# Patient Record
Sex: Female | Born: 1990 | Race: Black or African American | Hispanic: No | Marital: Single | Smoking: Never smoker
Health system: Southern US, Community
[De-identification: ages and names within clinical notes are randomized; demographics above are authoritative.]

---

## 2015-08-11 ENCOUNTER — Encounter: Payer: Medicaid Other | Admitting: Obstetrics & Gynecology

## 2015-08-22 ENCOUNTER — Encounter: Payer: Self-pay | Admitting: *Deleted

## 2015-08-22 ENCOUNTER — Emergency Department: Payer: Medicaid Other

## 2015-08-22 ENCOUNTER — Emergency Department
Admission: EM | Admit: 2015-08-22 | Discharge: 2015-08-23 | Disposition: A | Discharge status: Home / Self Care | Payer: Medicaid Other | Attending: Emergency Medicine | Admitting: Emergency Medicine

## 2015-08-22 DIAGNOSIS — Y929 Unspecified place or not applicable: Secondary | ICD-10-CM | POA: Insufficient documentation

## 2015-08-22 DIAGNOSIS — M79641 Pain in right hand: Secondary | ICD-10-CM | POA: Diagnosis present

## 2015-08-22 DIAGNOSIS — S60221A Contusion of right hand, initial encounter: Secondary | ICD-10-CM

## 2015-08-22 DIAGNOSIS — Y999 Unspecified external cause status: Secondary | ICD-10-CM | POA: Insufficient documentation

## 2015-08-22 DIAGNOSIS — Y9389 Activity, other specified: Secondary | ICD-10-CM | POA: Diagnosis not present

## 2015-08-22 NOTE — ED Notes (Signed)
POCT Results were NEGATIVE  

## 2015-08-22 NOTE — ED Notes (Signed)
Urine was collected but label was not affixed. RN notified, will re-collect urine for UA analysis. Urine specimen was obtained long enough to get urine preg. Which was negative.
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
## 2015-08-23 MED ORDER — IBUPROFEN 600 MG PO TABS: 600.0000 mg | ORAL_TABLET | Freq: Three times a day (TID) | ORAL | Status: DC | PRN

## 2015-08-23 MED ORDER — IBUPROFEN 600 MG PO TABS
600.0000 mg | ORAL_TABLET | Freq: Once | ORAL | Status: AC
  Administered 2015-08-23: 600 mg via ORAL
  Filled 2015-08-23: qty 1

## 2015-08-23 MED ORDER — HYDROCODONE-ACETAMINOPHEN 5-325 MG PO TABS
1.0000 | ORAL_TABLET | Freq: Once | ORAL | Status: AC
  Administered 2015-08-23: 1 via ORAL
  Filled 2015-08-23: qty 1

## 2015-08-23 NOTE — Discharge Instructions (Signed)
1. Take pain medicine as needed (Motrin #15). 2. Apply ice to affected area several times daily. 3. Return to the ER for worsening symptoms, increased swelling, numbness/tingling or other concerns.  Hand Contusion A hand contusion is a deep bruise on your hand area. Contusions are the result of an injury that caused bleeding under the skin. The contusion may turn blue, purple, or yellow. Minor injuries will give you a painless contusion, but more severe contusions may stay painful and swollen for a few weeks. CAUSES  A contusion is usually caused by a blow, trauma, or direct force to an area of the body. SYMPTOMS   Swelling and redness of the injured area.  Discoloration of the injured area.  Tenderness and soreness of the injured area.  Pain. DIAGNOSIS  The diagnosis can be made by taking a history and performing a physical exam. An X-ray, CT scan, or MRI may be needed to determine if there were any associated injuries, such as broken bones (fractures). TREATMENT  Often, the best treatment for a hand contusion is resting, elevating, icing, and applying cold compresses to the injured area. Over-the-counter medicines may also be recommended for pain control. HOME CARE INSTRUCTIONS   Put ice on the injured area.  Put ice in a plastic bag.  Place a towel between your skin and the bag.  Leave the ice on for 15-20 minutes, 03-04 times a day.  Only take over-the-counter or prescription medicines as directed by your caregiver. Your caregiver may recommend avoiding anti-inflammatory medicines (aspirin, ibuprofen, and naproxen) for 48 hours because these medicines may increase bruising.  If told, use an elastic wrap as directed. This can help reduce swelling. You may remove the wrap for sleeping, showering, and bathing. If your fingers become numb, cold, or blue, take the wrap off and reapply it more loosely.  Elevate your hand with pillows to reduce swelling.  Avoid overusing your hand  if it is painful. SEEK IMMEDIATE MEDICAL CARE IF:   You have increased redness, swelling, or pain in your hand.  Your swelling or pain is not relieved with medicines.  You have loss of feeling in your hand or are unable to move your fingers.  Your hand turns cold or blue.  You have pain when you move your fingers.  Your hand becomes warm to the touch.  Your contusion does not improve in 2 days. MAKE SURE YOU:   Understand these instructions.  Will watch your condition.  Will get help right away if you are not doing well or get worse.   This information is not intended to replace advice given to you by your health care provider. Make sure you discuss any questions you have with your health care provider.   Document Released: 10/22/2001 Document Revised: 01/25/2012 Document Reviewed: 10/24/2011 Elsevier Interactive Patient Education 2016 Else[employer] Inc[employer]  Cryotherapy Cryotherapy means treatment with cold. Ice or gel packs can be used to reduce both pain and swelling. Ice is the most helpful within the first 24 to 48 hours after an injury or flare-up from overusing a muscle or joint. Sprains, strains, spasms, burning pain, shooting pain, and aches can all be eased with ice. Ice can also be used when recovering from surgery. Ice is effective, has very few side effects, and is safe for most people to use. PRECAUTIONS  Ice is not a safe treatment option for people with:  Raynaud phenomenon. This is a condition affecting small blood vessels in the extremities. Exposure to cold may  cause your problems to return.  Cold hypersensitivity. There are many forms of cold hypersensitivity, including:  Cold urticaria. Red, itchy hives appear on the skin when the tissues begin to warm after being iced.  Cold erythema. This is a red, itchy rash caused by exposure to cold.  Cold hemoglobinuria. Red blood cells break down when the tissues begin to warm after being iced. The hemoglobin that carry  oxygen are passed into the urine because they cannot combine with blood proteins fast enough.  Numbness or altered sensitivity in the area being iced. If you have any of the following conditions, do not use ice until you have discussed cryotherapy with your caregiver:  Heart conditions, such as arrhythmia, angina, or chronic heart disease.  High blood pressure.  Healing wounds or open skin in the area being iced.  Current infections.  Rheumatoid arthritis.  Poor circulation.  Diabetes. Ice slows the blood flow in the region it is applied. This is beneficial when trying to stop inflamed tissues from spreading irritating chemicals to surrounding tissues. However, if you expose your skin to cold temperatures for too long or without the proper protection, you can damage your skin or nerves. Watch for signs of skin damage due to cold. HOME CARE INSTRUCTIONS Follow these tips to use ice and cold packs safely.  Place a dry or damp towel between the ice and skin. A damp towel will cool the skin more quickly, so you may need to shorten the time that the ice is used.  For a more rapid response, add gentle compression to the ice.  Ice for no more than 10 to 20 minutes at a time. The bonier the area you are icing, the less time it will take to get the benefits of ice.  Check your skin after 5 minutes to make sure there are no signs of a poor response to cold or skin damage.  Rest 20 minutes or more between uses.  Once your skin is numb, you can end your treatment. You can test numbness by very lightly touching your skin. The touch should be so light that you do not see the skin dimple from the pressure of your fingertip. When using ice, most people will feel these normal sensations in this order: cold, burning, aching, and numbness.  Do not use ice on someone who cannot communicate their responses to pain, such as small children or people with dementia. HOW TO MAKE AN ICE PACK Ice packs are  the most common way to use ice therapy. Other methods include ice massage, ice baths, and cryosprays. Muscle creams that cause a cold, tingly feeling do not offer the same benefits that ice offers and should not be used as a substitute unless recommended by your caregiver. To make an ice pack, do one of the following:  Place crushed ice or a bag of frozen vegetables in a sealable plastic bag. Squeeze out the excess air. Place this bag inside another plastic bag. Slide the bag into a pillowcase or place a damp towel between your skin and the bag.  Mix 3 parts water with 1 part rubbing alcohol. Freeze the mixture in a sealable plastic bag. When you remove the mixture from the freezer, it will be slushy. Squeeze out the excess air. Place this bag inside another plastic bag. Slide the bag into a pillowcase or place a damp towel between your skin and the bag. SEEK MEDICAL CARE IF:  You develop white spots on your skin. This may  give the skin a blotchy (mottled) appearance.  Your skin turns blue or pale.  Your skin becomes waxy or hard.  Your swelling gets worse. MAKE SURE YOU:   Understand these instructions.  Will watch your condition.  Will get help right away if you are not doing well or get worse.   This information is not intended to replace advice given to you by your health care provider. Make sure you discuss any questions you have with your health care provider.   Document Released: 12/27/2010 Document Revised: 05/23/2014 Document Reviewed: 12/27/2010 Elsevier Interactive Patient Education [employer]
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
## 2016-02-08 ENCOUNTER — Other Ambulatory Visit (HOSPITAL_COMMUNITY)
Admission: RE | Admit: 2016-02-08 | Discharge: 2016-02-08 | Disposition: A | Discharge status: Home / Self Care | Payer: Medicaid Other | Source: Ambulatory Visit | Attending: Obstetrics and Gynecology | Admitting: Obstetrics and Gynecology

## 2016-02-08 ENCOUNTER — Encounter: Payer: Self-pay | Admitting: Obstetrics and Gynecology

## 2016-02-08 ENCOUNTER — Ambulatory Visit (INDEPENDENT_AMBULATORY_CARE_PROVIDER_SITE_OTHER): Payer: Medicaid Other | Admitting: Obstetrics and Gynecology

## 2016-02-08 VITALS — BP 106/70 | HR 58 | Wt 139.0 lb

## 2016-02-08 DIAGNOSIS — Z113 Encounter for screening for infections with a predominantly sexual mode of transmission: Secondary | ICD-10-CM | POA: Diagnosis not present

## 2016-02-08 DIAGNOSIS — Z30011 Encounter for initial prescription of contraceptive pills: Secondary | ICD-10-CM | POA: Diagnosis not present

## 2016-02-08 MED ORDER — NORGESTIMATE-ETH ESTRADIOL 0.25-35 MG-MCG PO TABS: 1.0000 | ORAL_TABLET | Freq: Every day | ORAL | 11 refills | Status: DC

## 2016-02-08 NOTE — Progress Notes (Signed)
25 yo G0 presenting today for STD testing and initiation of birth control. Patient is sexually active and usually uses condoms for contraception. She reports unprotected intercourse a week ago and desires to be screened for STD. She reports the presence of a non-pruritic, odorless discharge. She is interested in re-starting birth control pills. Patient reports regular monthly cycles with 5 days of vaginal bleeding. She is without any other complaints  No past medical history on file. No past surgical history on file.  No family history on file. Social History  Substance Use Topics  . Smoking status: Never Smoker  . Smokeless tobacco: Never Used  . Alcohol use No   ROS See pertinent in HPI  Blood pressure 106/70, pulse (!) 58, weight 139 lb (63 kg), last menstrual period 01/09/2016. GENERAL: Well-developed, well-nourished female in no acute distress.  ABDOMEN: Soft, nontender, nondistended. No organomegaly. PELVIC: Normal external female genitalia. Vagina is pink and rugated.  Normal discharge. Normal appearing cervix. Uterus is normal in size. No adnexal mass or tenderness. EXTREMITIES: No cyanosis, clubbing, or edema, 2+ distal pulses.  A/P 25 yo here for STI testing and contraception - Rx Sprintec provided with 12 refills - GC/Cl and wet prep collected - Patient will be contacted with any abnormal results - RTC prn

## 2016-02-09 ENCOUNTER — Telehealth: Payer: Self-pay

## 2016-02-09 LAB — WET PREP, GENITAL
Trich, Wet Prep: NONE SEEN
WBC WET PREP: NONE SEEN
YEAST WET PREP: NONE SEEN

## 2016-02-09 MED ORDER — METRONIDAZOLE 500 MG PO TABS: 500.0000 mg | ORAL_TABLET | Freq: Two times a day (BID) | ORAL | 0 refills | Status: DC

## 2016-02-09 NOTE — Addendum Note (Signed)
Addended by: Catalina AntiguaONSTANT, Azuree Minish on: 02/09/2016 07:28 AM   Modules accepted: Orders

## 2016-02-09 NOTE — Telephone Encounter (Signed)
Patient made aware that RX sent in for bacterial vaginosis. Patient also states that provider mentioned giving her a years worth of birth control. Explained to the patient she did prescribe a years worth of birth control but she will have to pick up a refill at the pharmacy every month. Made patient aware that GC/ chl not resulted yet. Armandina StammerJennifer Howard RNBSN

## 2016-02-10 ENCOUNTER — Telehealth: Payer: Self-pay | Admitting: *Deleted

## 2016-02-10 LAB — GC/CHLAMYDIA PROBE AMP (~~LOC~~) NOT AT ARMC
CHLAMYDIA, DNA PROBE: NEGATIVE
NEISSERIA GONORRHEA: NEGATIVE

## 2016-02-10 NOTE — Telephone Encounter (Signed)
-----   Message from Catalina AntiguaPeggy Constant, MD sent at 02/09/2016  7:28 AM EDT ----- Please inform patient of BV infection. Flagyl has been e-prescribed  AnimatorThanks  Peggy

## 2016-02-10 NOTE — Telephone Encounter (Signed)
Pt was notified yesterday by Victorino DikeJennifer of wet prep result and medication regimen.

## 2016-02-18 ENCOUNTER — Other Ambulatory Visit: Payer: Self-pay | Admitting: Obstetrics and Gynecology

## 2016-02-29 ENCOUNTER — Other Ambulatory Visit: Payer: Self-pay | Admitting: Obstetrics and Gynecology

## 2016-02-29 NOTE — Telephone Encounter (Signed)
-----   Message from Kathee Deltonarrie L Hillman, RN sent at 02/29/2016 11:46 AM EDT ----- This patient is requesting a refill on flagyl  Thanks!

## 2016-03-08 ENCOUNTER — Telehealth: Payer: Self-pay | Admitting: *Deleted

## 2016-03-08 DIAGNOSIS — N760 Acute vaginitis: Principal | ICD-10-CM

## 2016-03-08 DIAGNOSIS — B9689 Other specified bacterial agents as the cause of diseases classified elsewhere: Secondary | ICD-10-CM

## 2016-03-08 MED ORDER — METRONIDAZOLE 0.75 % VA GEL: 1.0000 | Freq: Every day | VAGINAL | 0 refills | Status: DC

## 2016-03-08 NOTE — Telephone Encounter (Signed)
-----   Message from Olevia BowensJacinda S Battle sent at 03/08/2016 10:44 AM EDT ----- Regarding: Advise Contact: [phone] Wants to know if there is a different option she can take, states she gags on the pills, not sure if she is referring to Flagyl (should be complete by now) or OCP

## 2016-03-08 NOTE — Telephone Encounter (Signed)
Spoke to pt, having difficulty taking the Flagyl pills, wants to know if there is another method.  Informed her of Metrogel that she could use instead, sent rx to pharmacy and instructed pt on medication use.

## 2016-03-22 ENCOUNTER — Ambulatory Visit (INDEPENDENT_AMBULATORY_CARE_PROVIDER_SITE_OTHER): Payer: Medicaid Other | Admitting: Obstetrics & Gynecology

## 2016-03-22 ENCOUNTER — Encounter: Payer: Self-pay | Admitting: Obstetrics & Gynecology

## 2016-03-22 VITALS — BP 111/70 | HR 80 | Ht 64.0 in | Wt 142.0 lb

## 2016-03-22 DIAGNOSIS — Z3202 Encounter for pregnancy test, result negative: Secondary | ICD-10-CM | POA: Diagnosis not present

## 2016-03-22 DIAGNOSIS — Z30430 Encounter for insertion of intrauterine contraceptive device: Secondary | ICD-10-CM

## 2016-03-22 LAB — POCT URINE PREGNANCY: Preg Test, Ur: NEGATIVE

## 2016-03-22 MED ORDER — LEVONORGESTREL 18.6 MCG/DAY IU IUD
INTRAUTERINE_SYSTEM | Freq: Once | INTRAUTERINE | Status: AC
  Administered 2016-03-22: 09:00:00 via INTRAUTERINE

## 2016-03-22 NOTE — Patient Instructions (Signed)

## 2016-03-22 NOTE — Progress Notes (Signed)
    GYNECOLOGY CLINIC PROCEDURE NOTE  Cheyenne OaksDestiny Shaw is a 25 y.o. G1P1001 here for Liletta IUD insertion. No GYN concerns.  Last pap smear was in 2016 at outside institution and was normal.  IUD Insertion Procedure Note Patient identified, informed consent performed, consent signed.   Discussed risks of irregular bleeding, cramping, infection, malpositioning or misplacement of the IUD outside the uterus which may require further procedure such as laparoscopy. Time out was performed.  Urine pregnancy test negative.  Speculum placed in the vagina.  Cervix visualized.  Cleaned with Betadine x 2.  Grasped anteriorly with a single tooth tenaculum.  Uterus sounded to 8 cm.  Liletta IUD placed per manufacturer's recommendations.  Strings trimmed to 3 cm. Tenaculum was removed, good hemostasis noted.  Patient tolerated procedure well.   Patient was given post-procedure instructions.  She was advised to have backup contraception for one week.  Patient was also asked to check IUD strings periodically and follow up in 4 weeks for IUD check.   Cheyenne Shaw  Cheyenne Dineen, MD, FACOG Attending Obstetrician & Gynecologist, [facility], [facility]

## 2016-04-05 ENCOUNTER — Emergency Department
Admission: EM | Admit: 2016-04-05 | Discharge: 2016-04-05 | Disposition: A | Discharge status: Home / Self Care | Payer: Medicaid Other | Attending: Emergency Medicine | Admitting: Emergency Medicine

## 2016-04-05 ENCOUNTER — Encounter: Payer: Self-pay | Admitting: Emergency Medicine

## 2016-04-05 ENCOUNTER — Emergency Department: Payer: Medicaid Other

## 2016-04-05 DIAGNOSIS — Z791 Long term (current) use of non-steroidal anti-inflammatories (NSAID): Secondary | ICD-10-CM | POA: Insufficient documentation

## 2016-04-05 DIAGNOSIS — G8929 Other chronic pain: Secondary | ICD-10-CM | POA: Insufficient documentation

## 2016-04-05 DIAGNOSIS — M545 Low back pain: Secondary | ICD-10-CM | POA: Insufficient documentation

## 2016-04-05 LAB — URINALYSIS COMPLETE WITH MICROSCOPIC (ARMC ONLY)
BACTERIA UA: NONE SEEN
Bilirubin Urine: NEGATIVE
Glucose, UA: NEGATIVE mg/dL
Ketones, ur: NEGATIVE mg/dL
LEUKOCYTES UA: NEGATIVE
NITRITE: NEGATIVE
PH: 7 (ref 5.0–8.0)
PROTEIN: NEGATIVE mg/dL
RBC / HPF: NONE SEEN RBC/hpf (ref 0–5)
Specific Gravity, Urine: 1.02 (ref 1.005–1.030)

## 2016-04-05 LAB — POCT PREGNANCY, URINE: PREG TEST UR: NEGATIVE

## 2016-04-05 MED ORDER — NAPROXEN 500 MG PO TABS: 500.0000 mg | ORAL_TABLET | Freq: Two times a day (BID) | ORAL | 0 refills | Status: DC

## 2016-04-05 MED ORDER — KETOROLAC TROMETHAMINE 30 MG/ML IJ SOLN
30.0000 mg | Freq: Once | INTRAMUSCULAR | Status: AC
  Administered 2016-04-05: 30 mg via INTRAVENOUS
  Filled 2016-04-05: qty 1

## 2016-04-05 NOTE — Discharge Instructions (Signed)
[facility]. Begin taking naproxen 500 mg twice a day every day with food. You may use ice or heat to your back often to help with your pain symptoms.

## 2016-04-05 NOTE — ED Provider Notes (Signed)
[facility] Emergency Department Provider Note  ____________________________________________   First MD Initiated Contact with Patient 04/05/16 1008     (approximate)  I have reviewed the triage vital signs and the nursing notes.   HISTORY  Chief Complaint Back Pain   HPI Cheyenne Shaw is a 25 y.o. female is here with complaint of back pain. Patient states that she has had low back pain for more than 6 months but she has not seen anyone prior to today to explain why she is having this discomfort. Patient is tried over-the-counter medication infrequently and at lower than therapeutic doses without any relief. Patient denies any urinary symptoms or vaginal problems. Patient has continued to ambulate without any difficulty and is continued her normal activity. She denies any paresthesias into her lower extremities, no incontinence of bowel or bladder, no history of kidney stones. She rates her pain is 7 out of 10 at this time.   History reviewed. No pertinent past medical history.  There are no active problems to display for this patient.   History reviewed. No pertinent surgical history.  Prior to Admission medications   Medication Sig Start Date End Date Taking? Authorizing Provider  ibuprofen (ADVIL,MOTRIN) 600 MG tablet Take 1 tablet (600 mg total) by mouth every 8 (eight) hours as needed. 08/23/15   Irean HongJade J Sung, MD  naproxen (NAPROSYN) 500 MG tablet Take 1 tablet (500 mg total) by mouth 2 (two) times daily with a meal. 04/05/16   Tommi Rumpshonda L Jaydee Ingman, PA-C    Allergies Patient has no known allergies.  No family history on file.  Social History Social History  Substance Use Topics  . Smoking status: Never Smoker  . Smokeless tobacco: Never Used  . Alcohol use Yes     Comment: Occasional    Review of Systems Constitutional: No fever/chills Eyes: No visual changes. ENT: No sore throat. Cardiovascular: Denies chest pain. Respiratory: Denies  shortness of breath. Gastrointestinal: No abdominal pain.  No nausea, no vomiting.   Genitourinary: Negative for dysuria. Musculoskeletal: Positive for low back pain. Skin: Negative for rash. Neurological: Negative for headaches, focal weakness or numbness.  10-point ROS otherwise negative.  ____________________________________________   PHYSICAL EXAM:  VITAL SIGNS: ED Triage Vitals  Enc Vitals Group     BP 04/05/16 0848 116/73     Pulse Rate 04/05/16 0848 76     Resp 04/05/16 0848 16     Temp 04/05/16 0848 98.1 F (36.7 C)     Temp Source 04/05/16 0848 Oral     SpO2 04/05/16 0848 98 %     Weight 04/05/16 0849 130 lb (59 kg)     Height 04/05/16 0849 5\' 4"  (1.626 m)     Head Circumference --      Peak Flow --      Pain Score 04/05/16 0849 7     Pain Loc --      Pain Edu? --      Excl. in GC? --     Constitutional: Alert and oriented. Well appearing and in no acute distress. Eyes: Conjunctivae are normal. PERRL. EOMI. Head: Atraumatic. Nose: No congestion/rhinnorhea. Neck: No stridor.   Cardiovascular: Normal rate, regular rhythm. Grossly normal heart sounds.  Good peripheral circulation. Respiratory: Normal respiratory effort.  No retractions. Lungs CTAB. Gastrointestinal: Soft and nontender. No distention.  No CVA tenderness. Musculoskeletal: On examination of the lower back there is no gross deformity noted. There is no restriction of range of motion and no  active muscle spasms were seen. Patient is able to ambulate without assistance or difficulty. There is tenderness midline bilateral low back and paravertebral muscles are tender. No gross deformity was noted. Straight leg raises were negative. Neurologic:  Normal speech and language. No gross focal neurologic deficits are appreciated. Reflexes were 2+ bilaterally. No gait instability. Skin:  Skin is warm, dry and intact. No rash noted. Psychiatric: Mood and affect are normal. Speech and behavior are  normal.  ____________________________________________   LABS (all labs ordered are listed, but only abnormal results are displayed)  Labs Reviewed  URINALYSIS COMPLETEWITH MICROSCOPIC ([facility] ONLY) - Abnormal; Notable for the following:       Result Value   Color, Urine YELLOW (*)    APPearance CLEAR (*)    Hgb urine dipstick 1+ (*)    Squamous Epithelial / LPF 0-5 (*)    All other components within normal limits  POC URINE PREG, ED  POCT PREGNANCY, URINE     RADIOLOGY Lumbar spine x-ray per radiologist: IMPRESSION:  Scoliosis. No fracture or spondylolisthesis. No apparent  arthropathy. Intrauterine device in mid pelvis.       ____________________________________________   PROCEDURES  Procedure(s) performed: None  Procedures  Critical Care performed: No  ____________________________________________   INITIAL IMPRESSION / ASSESSMENT AND PLAN / ED COURSE  Pertinent labs & imaging results that were available during my care of the patient were reviewed by me and considered in my medical decision making (see chart for details).    Clinical Course    Patient was given Toradol injection IM while in the emergency room and sent for x-rays. Patient was made aware that her x-rays did not show any acute injury. She was given a prescription for naproxen 500 mg twice a day with food daily and use heat or ice to her back for her pain symptoms. [facility] if any continued problems.  ____________________________________________   FINAL CLINICAL IMPRESSION(S) / ED DIAGNOSES  Final diagnoses:  Chronic midline low back pain without sciatica      NEW MEDICATIONS STARTED DURING THIS VISIT:  Discharge Medication List as of 04/05/2016 11:21 AM    START taking these medications   Details  naproxen (NAPROSYN) 500 MG tablet Take 1 tablet (500 mg total) by mouth 2 (two) times daily with a meal., Starting Tue 04/05/2016,  Print         Note:  This document was prepared using Dragon voice recognition software and may include unintentional dictation errors.    Tommi RumpsRhonda L Jhair Witherington, PA-C 04/05/16 1425    Jennye MoccasinBrian S Quigley, MD 04/05/16 [phone]

## 2016-04-05 NOTE — ED Notes (Signed)
States she is having intermittent lower back pain w/o injury for "awhile" denies any fever or urinary sx's   Ambulates well no limp to treatment room

## 2016-04-05 NOTE — ED Triage Notes (Signed)
Reports lower back pain, worse with movement.  Denies urinary sx.  Ambulates without difficulty

## 2016-04-26 ENCOUNTER — Encounter: Payer: Self-pay | Admitting: Obstetrics & Gynecology

## 2016-04-26 ENCOUNTER — Ambulatory Visit (INDEPENDENT_AMBULATORY_CARE_PROVIDER_SITE_OTHER): Payer: Medicaid Other | Admitting: Obstetrics & Gynecology

## 2016-04-26 ENCOUNTER — Other Ambulatory Visit (HOSPITAL_COMMUNITY)
Admission: RE | Admit: 2016-04-26 | Discharge: 2016-04-26 | Disposition: A | Discharge status: Home / Self Care | Payer: Medicaid Other | Source: Ambulatory Visit | Attending: Obstetrics & Gynecology | Admitting: Obstetrics & Gynecology

## 2016-04-26 VITALS — BP 118/79 | HR 77 | Resp 18 | Ht 64.0 in | Wt 142.0 lb

## 2016-04-26 DIAGNOSIS — Z113 Encounter for screening for infections with a predominantly sexual mode of transmission: Secondary | ICD-10-CM

## 2016-04-26 DIAGNOSIS — Z30430 Encounter for insertion of intrauterine contraceptive device: Secondary | ICD-10-CM

## 2016-04-26 NOTE — Progress Notes (Signed)
     GYNECOLOGY OFFICE PROGRESS NOTE  History:  25 y.o. G1P1001 here today for today for IUD string check; Liletta IUD was placed  03/22/16. No complaints about the IUD, no concerning side effects. Desires STI screen today, no reported exposure.  The following portions of the patient's history were reviewed and updated as appropriate: allergies, current medications, past family history, past medical history, past social history, past surgical history and problem list. Last pap smear was in 2016 at outside institution and was normal.  Review of Systems:  Pertinent items are noted in HPI.   Objective:  Physical Exam Blood pressure 118/79, pulse 77, resp. rate 18, height 5\' 4"  (1.626 m), weight 142 lb (64.4 kg), last menstrual period 04/11/2016. CONSTITUTIONAL: Well-developed, well-nourished female in no acute distress.  HENT:  Normocephalic, atraumatic. External right and left ear normal. Oropharynx is clear and moist EYES: Conjunctivae and EOM are normal. Pupils are equal, round, and reactive to light. No scleral icterus.  NECK: Normal range of motion, supple, no masses CARDIOVASCULAR: Normal heart rate noted RESPIRATORY: Effort and breath sounds normal, no problems with respiration noted ABDOMEN: Soft, no distention noted.   PELVIC: Normal appearing external genitalia; normal appearing vaginal mucosa and cervix.  IUD strings visualized, about 1 cm in length outside cervix.   Assessment & Plan:  1. Screen for STD (sexually transmitted disease) Will follow up results and manage accordingly. - Cervicovaginal ancillary only - HIV antibody - Hepatitis C antibody - Hepatitis B surface antigen - RPR  2. Encounter for IUD insertion Normal IUD check. Patient to keep IUD in place for five years; can come in for removal if she desires pregnancy within the next five years. Routine preventative health maintenance measures emphasized.    Jaynie CollinsUGONNA  Timya Trimmer, MD, FACOG Attending Obstetrician &  Gynecologist, [facility]

## 2016-04-26 NOTE — Patient Instructions (Signed)
Return to clinic for any scheduled appointments or for any gynecologic concerns as needed.   

## 2016-04-27 LAB — RPR

## 2016-04-27 LAB — HIV ANTIBODY (ROUTINE TESTING W REFLEX): HIV 1&2 Ab, 4th Generation: NONREACTIVE

## 2016-04-27 LAB — HEPATITIS C ANTIBODY: HCV Ab: NEGATIVE

## 2016-04-27 LAB — CERVICOVAGINAL ANCILLARY ONLY
CHLAMYDIA, DNA PROBE: NEGATIVE
Neisseria Gonorrhea: NEGATIVE
TRICH (WINDOWPATH): NEGATIVE

## 2016-04-27 LAB — HEPATITIS B SURFACE ANTIGEN: Hepatitis B Surface Ag: NEGATIVE

## 2016-05-01 ENCOUNTER — Other Ambulatory Visit: Payer: Self-pay | Admitting: Emergency Medicine

## 2016-11-01 ENCOUNTER — Encounter: Payer: Self-pay | Admitting: Obstetrics & Gynecology

## 2016-11-01 ENCOUNTER — Ambulatory Visit (INDEPENDENT_AMBULATORY_CARE_PROVIDER_SITE_OTHER): Payer: 59 | Admitting: Obstetrics & Gynecology

## 2016-11-01 VITALS — BP 122/70 | HR 98 | Ht 64.0 in | Wt 143.0 lb

## 2016-11-01 DIAGNOSIS — Z01419 Encounter for gynecological examination (general) (routine) without abnormal findings: Secondary | ICD-10-CM

## 2016-11-01 DIAGNOSIS — Z113 Encounter for screening for infections with a predominantly sexual mode of transmission: Secondary | ICD-10-CM | POA: Diagnosis not present

## 2016-11-01 NOTE — Patient Instructions (Signed)
Preventive Care 18-39 Years, Female Preventive care refers to lifestyle choices and visits with your health care provider that can promote health and wellness. What does preventive care include?  A yearly physical exam. This is also called an annual well check.  Dental exams once or twice a year.  Routine eye exams. Ask your health care provider how often you should have your eyes checked.  Personal lifestyle choices, including: ? Daily care of your teeth and gums. ? Regular physical activity. ? Eating a healthy diet. ? Avoiding tobacco and drug use. ? Limiting alcohol use. ? Practicing safe sex. ? Taking vitamin and mineral supplements as recommended by your health care provider. What happens during an annual well check? The services and screenings done by your health care provider during your annual well check will depend on your age, overall health, lifestyle risk factors, and family history of disease. Counseling Your health care provider may ask you questions about your:  Alcohol use.  Tobacco use.  Drug use.  Emotional well-being.  Home and relationship well-being.  Sexual activity.  Eating habits.  Work and work Statistician.  Method of birth control.  Menstrual cycle.  Pregnancy history.  Screening You may have the following tests or measurements:  Height, weight, and BMI.  Diabetes screening. This is done by checking your blood sugar (glucose) after you have not eaten for a while (fasting).  Blood pressure.  Lipid and cholesterol levels. These may be checked every 5 years starting at age 66.  Skin check.  Hepatitis C blood test.  Hepatitis B blood test.  Sexually transmitted disease (STD) testing.  BRCA-related cancer screening. This may be done if you have a family history of breast, ovarian, tubal, or peritoneal cancers.  Pelvic exam and Pap test. This may be done every 3 years starting at age 40. Starting at age 59, this may be done every 5  years if you have a Pap test in combination with an HPV test.  Discuss your test results, treatment options, and if necessary, the need for more tests with your health care provider. Vaccines Your health care provider may recommend certain vaccines, such as:  Influenza vaccine. This is recommended every year.  Tetanus, diphtheria, and acellular pertussis (Tdap, Td) vaccine. You may need a Td booster every 10 years.  Varicella vaccine. You may need this if you have not been vaccinated.  HPV vaccine. If you are 69 or younger, you may need three doses over 6 months.  Measles, mumps, and rubella (MMR) vaccine. You may need at least one dose of MMR. You may also need a second dose.  Pneumococcal 13-valent conjugate (PCV13) vaccine. You may need this if you have certain conditions and were not previously vaccinated.  Pneumococcal polysaccharide (PPSV23) vaccine. You may need one or two doses if you smoke cigarettes or if you have certain conditions.  Meningococcal vaccine. One dose is recommended if you are age 27-21 years and a first-year college student living in a residence hall, or if you have one of several medical conditions. You may also need additional booster doses.  Hepatitis A vaccine. You may need this if you have certain conditions or if you travel or work in places where you may be exposed to hepatitis A.  Hepatitis B vaccine. You may need this if you have certain conditions or if you travel or work in places where you may be exposed to hepatitis B.  Haemophilus influenzae type b (Hib) vaccine. You may need this if  you have certain risk factors.  Talk to your health care provider about which screenings and vaccines you need and how often you need them. This information is not intended to replace advice given to you by your health care provider. Make sure you discuss any questions you have with your health care provider. Document Released: 06/28/2001 Document Revised: 01/20/2016  Document Reviewed: 03/03/2015 Elsevier Interactive Patient Education  2017 [employer].

## 2016-11-01 NOTE — Progress Notes (Signed)
GYNECOLOGY ANNUAL PREVENTATIVE CARE ENCOUNTER NOTE  Subjective:   Cheyenne Shaw is a 26 y.o. 531P1001 female here for a routine annual gynecologic exam.  Current complaints: desires annual STI testing because of some minor burning or itching x 3 days.   Denies abnormal vaginal bleeding, discharge, pelvic pain, problems with intercourse or other gynecologic concerns.    Gynecologic History Patient's last menstrual period was 11/01/2016. Contraception: IUD, Liletta placed 03/22/2016 Last Pap: 05/2015. Results were: normal  Obstetric History OB History  Gravida Para Term Preterm AB Living  1 1 1  0 0 1  SAB TAB Ectopic Multiple Live Births  0 0 0 0 1    # Outcome Date GA Lbr Len/2nd Weight Sex Delivery Anes PTL Lv  1 Term 01/31/13    F Vag-Spont   LIV      History reviewed. No pertinent past medical history.  History reviewed. No pertinent surgical history.  Current Outpatient Prescriptions on File Prior to Visit  Medication Sig Dispense Refill  . levonorgestrel (LILETTA, 52 MG,) 18.6 MCG/DAY IUD IUD 1 each by Intrauterine route once.     No current facility-administered medications on file prior to visit.     No Known Allergies  Social History   Social History  . Marital status: Single    Spouse name: N/A  . Number of children: N/A  . Years of education: N/A   Occupational History  . Not on file.   Social History Main Topics  . Smoking status: Never Smoker  . Smokeless tobacco: Never Used  . Alcohol use Yes     Comment: Occasional  . Drug use: No  . Sexual activity: Yes    Birth control/ protection: Condom, IUD   Other Topics Concern  . Not on file   Social History Narrative  . No narrative on file    History reviewed. No pertinent family history.  The following portions of the patient's history were reviewed and updated as appropriate: allergies, current medications, past family history, past medical history, past social history, past surgical history  and problem list.  Review of Systems Pertinent items noted in HPI and remainder of comprehensive ROS otherwise negative.   Objective:  BP 122/70   Pulse 98   Ht 5\' 4"  (1.626 m)   Wt 143 lb (64.9 kg)   LMP 11/01/2016   BMI 24.55 kg/m  CONSTITUTIONAL: Well-developed, well-nourished female in no acute distress.  HENT:  Normocephalic, atraumatic, External right and left ear normal. Oropharynx is clear and moist EYES: Conjunctivae and EOM are normal. Pupils are equal, round, and reactive to light. No scleral icterus.  NECK: Normal range of motion, supple, no masses.  Normal thyroid.  SKIN: Skin is warm and dry. No rash noted. Not diaphoretic. No erythema. No pallor. NEUROLOGIC: Alert and oriented to person, place, and time. Normal reflexes, muscle tone coordination. No cranial nerve deficit noted. PSYCHIATRIC: Normal mood and affect. Normal behavior. Normal judgment and thought content. CARDIOVASCULAR: Normal heart rate noted, regular rhythm RESPIRATORY: Clear to auscultation bilaterally. Effort and breath sounds normal, no problems with respiration noted. BREASTS: Symmetric in size. No masses, skin changes, nipple drainage, or lymphadenopathy. ABDOMEN: Soft, normal bowel sounds, no distention noted.  No tenderness, rebound or guarding.  PELVIC: Normal appearing external genitalia; normal appearing vaginal mucosa and cervix.  No abnormal discharge noted, currently on period. IUD strings seen.  Pap smear obtained.  Normal uterine size, no other palpable masses, no uterine or adnexal tenderness. MUSCULOSKELETAL: Normal range  of motion. No tenderness.  No cyanosis, clubbing, or edema.  2+ distal pulses.   Assessment:  Annual gynecologic examination with pap smear Desires STI screen   Plan:  Will follow up results of pap smear and manage accordingly. STI screen done Routine preventative health maintenance measures emphasized. Please refer to After Visit Summary for other counseling  recommendations.    Jaynie Collins, MD, FACOG Attending Obstetrician & Gynecologist, [facility]

## 2016-11-02 LAB — HEPATITIS B SURFACE ANTIGEN: Hepatitis B Surface Ag: NEGATIVE

## 2016-11-02 LAB — RPR: RPR: NONREACTIVE

## 2016-11-02 LAB — HEPATITIS C ANTIBODY: Hep C Virus Ab: 0.1 s/co ratio (ref 0.0–0.9)

## 2016-11-02 LAB — HIV ANTIBODY (ROUTINE TESTING W REFLEX): HIV SCREEN 4TH GENERATION: NONREACTIVE

## 2016-11-03 LAB — CERVICOVAGINAL ANCILLARY ONLY
BACTERIAL VAGINITIS: NEGATIVE
Candida vaginitis: NEGATIVE
Chlamydia: NEGATIVE
Neisseria Gonorrhea: NEGATIVE
TRICH (WINDOWPATH): NEGATIVE

## 2016-11-07 LAB — CYTOLOGY - PAP

## 2016-11-09 ENCOUNTER — Telehealth: Payer: Self-pay | Admitting: *Deleted

## 2016-11-09 NOTE — Telephone Encounter (Signed)
-----   Message from Ugonna A Anyanwu, MD sent at 11/08/2016  4:13 PM EDT ----- Please call patient and tell her to return for repeat pap, not enough cells were seen. Nothing is wrong.  Might had had too much cervical mucus etc.   

## 2016-11-09 NOTE — Telephone Encounter (Signed)
Spoke to pt, scheduled appt to repeat pap on 11-10-16.

## 2016-11-09 NOTE — Telephone Encounter (Signed)
Called pt, no answer, left message to call the office.  

## 2016-11-09 NOTE — Telephone Encounter (Signed)
-----   Message from Cheyenne NewcomerUgonna A Anyanwu, MD sent at 11/08/2016  4:13 PM EDT ----- Please call patient and tell her to return for repeat pap, not enough cells were seen. Nothing is wrong.  Might had had too much cervical mucus etc.

## 2016-11-10 ENCOUNTER — Ambulatory Visit: Payer: 59 | Admitting: Obstetrics & Gynecology

## 2016-11-10 ENCOUNTER — Encounter: Payer: Self-pay | Admitting: Obstetrics & Gynecology

## 2016-11-10 VITALS — BP 112/70 | HR 72 | Wt 142.0 lb

## 2016-11-10 DIAGNOSIS — R87615 Unsatisfactory cytologic smear of cervix: Secondary | ICD-10-CM

## 2016-11-10 NOTE — Progress Notes (Signed)
   GYNECOLOGY OFFICE VISIT NOTE  History:  26 y.o. G1P1001 here today for repeat pap smear; sample obtained on 11/01/16 was unsatisfactory. She denies any abnormal vaginal discharge, bleeding, pelvic pain or other concerns.   History reviewed. No pertinent past medical history.  History reviewed. No pertinent surgical history.  The following portions of the patient's history were reviewed and updated as appropriate: allergies, current medications, past family history, past medical history, past social history, past surgical history and problem list.    Review of Systems:  Pertinent items noted in HPI and remainder of comprehensive ROS otherwise negative.   Objective:  Physical Exam BP 112/70   Pulse 72   Wt 142 lb (64.4 kg)   LMP 11/01/2016   BMI 24.37 kg/m  CONSTITUTIONAL: Well-developed, well-nourished female in no acute distress.  ABDOMEN: Soft, normal bowel sounds, no distention noted.  No tenderness, rebound or guarding.  PELVIC: Normal appearing external genitalia; normal appearing vaginal mucosa and cervix. IUD strings seen. Normal appearing discharge.  Pap smear obtained.  Normal uterine size, no other palpable masses, no uterine or adnexal tenderness.  Assessment & Plan:  1. Unsatisfactory cervical Papanicolaou smear - Cytology - PAP obtained, will follow up results and manage accordingly. Apologized to patient for inconvenience of coming back in today and thanked her for agreeing to repeat collection.    Jaynie CollinsUGONNA  Vivianna Piccini, MD, FACOG Attending Obstetrician & Gynecologist, [facility], [facility]

## 2016-11-14 LAB — CYTOLOGY - PAP: DIAGNOSIS: NEGATIVE

## 2017-08-16 ENCOUNTER — Encounter: Payer: Self-pay | Admitting: Radiology

## 2018-07-16 IMAGING — CR DG LUMBAR SPINE 2-3V
3 series · 3 of 3 positions shown · non-contrast
Comparison: None.

CLINICAL DATA: Chronic pain

EXAM:
LUMBAR SPINE - 2-3 VIEW

[l-spine ap]
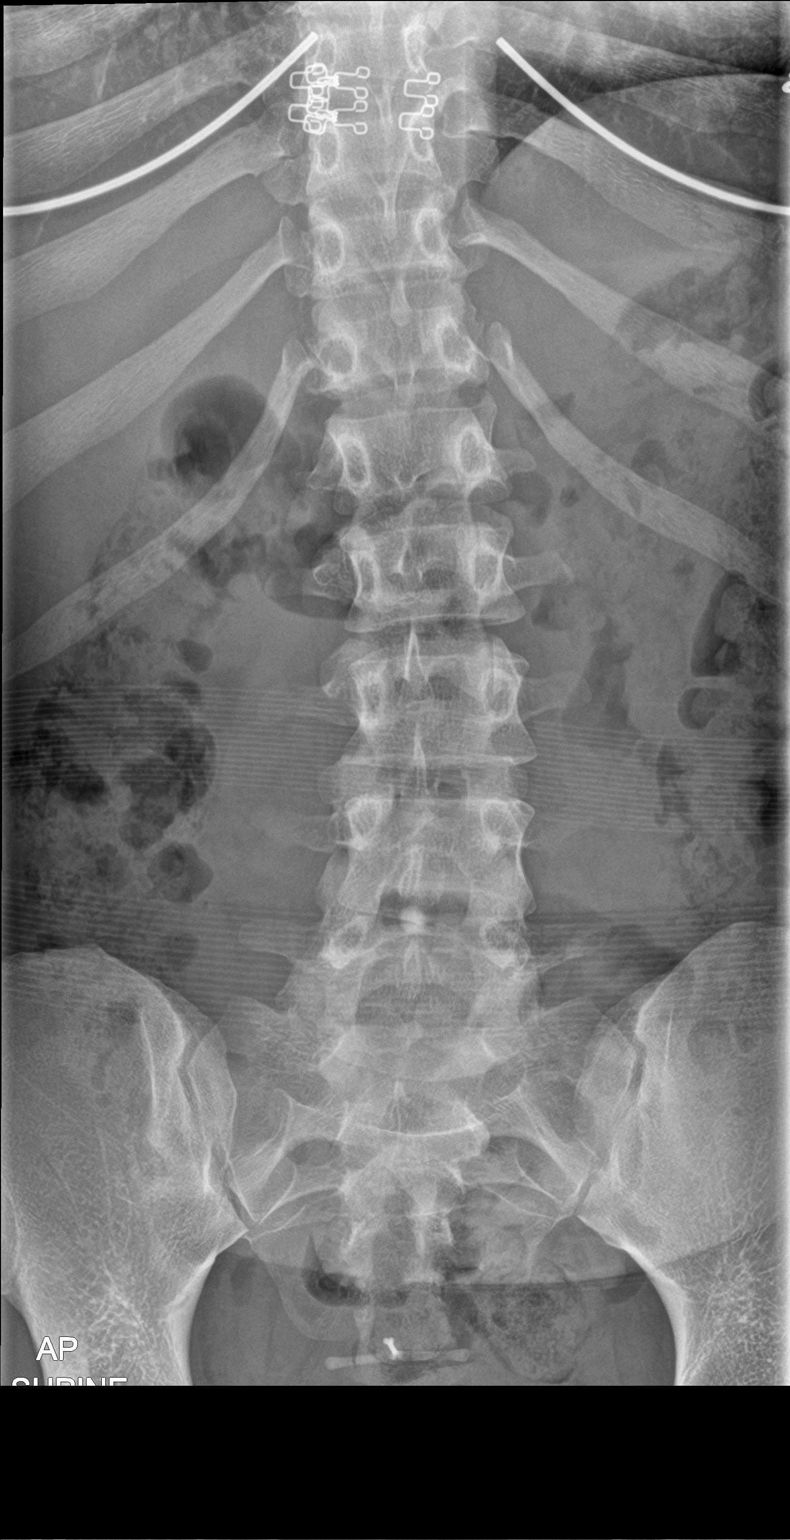

[l-spine lat]
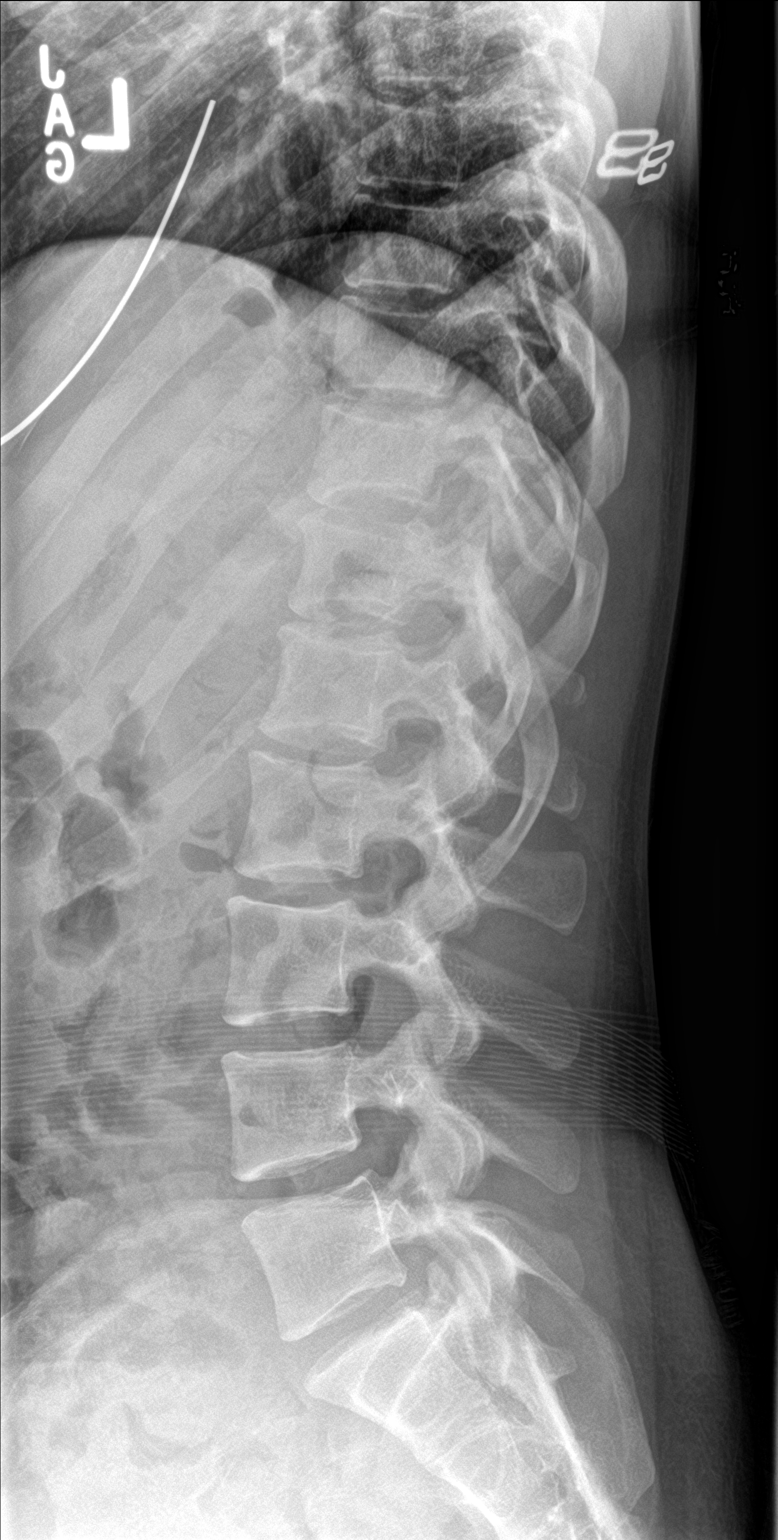

[l-spine spot]
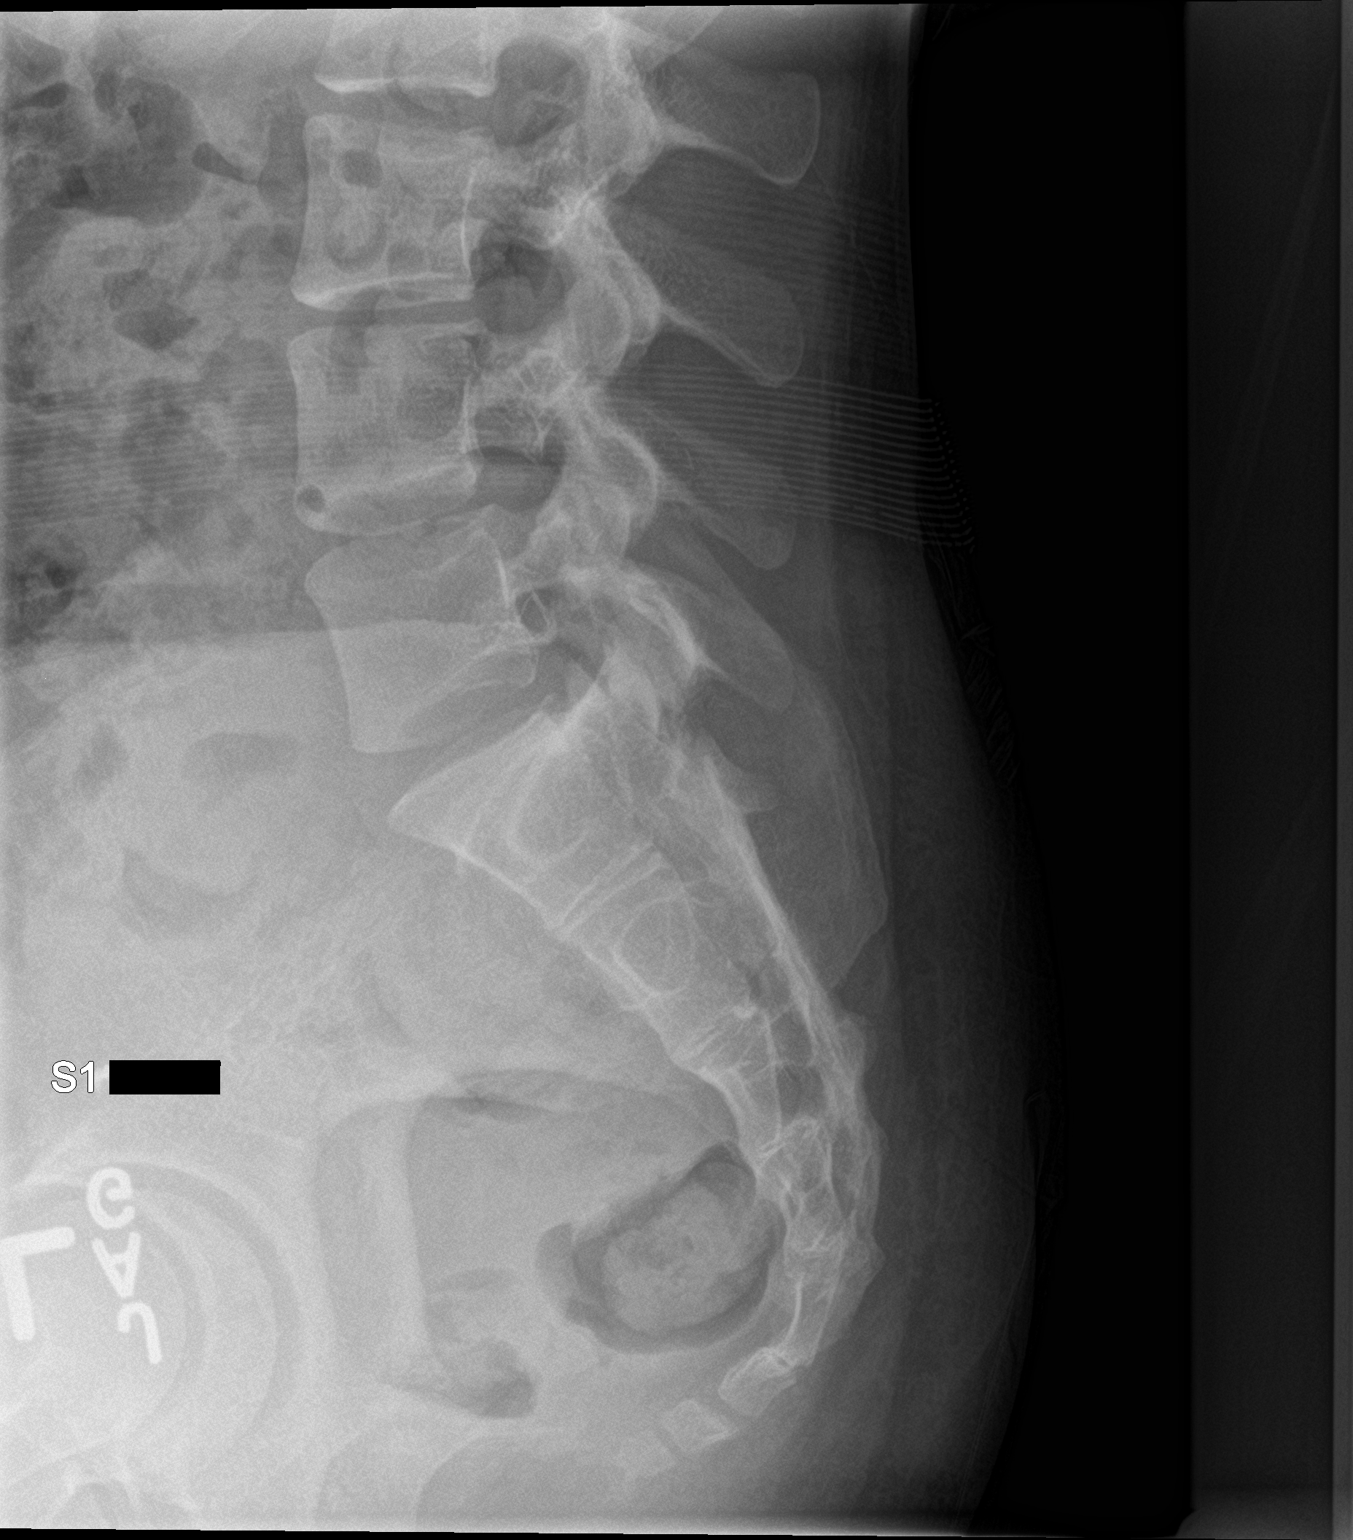

[3 of 3 positions shown; findings below may reference images not displayed]

FINDINGS: Frontal, lateral, and spot lumbosacral lateral images were obtained.
There are 5 non-rib-bearing lumbar type vertebral bodies. There is
lumbar levoscoliosis. There is no fracture or spondylolisthesis. The
disc spaces appear normal. No erosive change. There is an
intrauterine device in mid pelvis.
IMPRESSION: Scoliosis. No fracture or spondylolisthesis. No apparent
arthropathy. Intrauterine device in mid pelvis.
